# Patient Record
Sex: Female | Born: 1975 | Race: White | Hispanic: No | Marital: Married | Smoking: Former smoker
Health system: Southern US, Community
[De-identification: ages and names within clinical notes are randomized; demographics above are authoritative.]

## PROBLEM LIST (undated history)

## (undated) DIAGNOSIS — J45909 Unspecified asthma, uncomplicated: Secondary | ICD-10-CM

## (undated) HISTORY — DX: Unspecified asthma, uncomplicated: J45.909

---

## 2003-10-06 ENCOUNTER — Ambulatory Visit (HOSPITAL_COMMUNITY): Admission: RE | Admit: 2003-10-06 | Discharge: 2003-10-06 | Payer: Self-pay | Admitting: *Deleted

## 2004-10-31 ENCOUNTER — Other Ambulatory Visit: Admission: RE | Admit: 2004-10-31 | Discharge: 2004-10-31 | Payer: Self-pay | Admitting: Obstetrics & Gynecology

## 2011-03-30 ENCOUNTER — Emergency Department (HOSPITAL_COMMUNITY): Payer: Self-pay

## 2011-03-30 ENCOUNTER — Emergency Department (HOSPITAL_COMMUNITY)
Admission: EM | Admit: 2011-03-30 | Discharge: 2011-03-30 | Disposition: A | Discharge status: Home / Self Care | Payer: Self-pay | Attending: Emergency Medicine | Admitting: Emergency Medicine

## 2011-03-30 DIAGNOSIS — J45909 Unspecified asthma, uncomplicated: Secondary | ICD-10-CM | POA: Insufficient documentation

## 2011-03-30 DIAGNOSIS — IMO0002 Reserved for concepts with insufficient information to code with codable children: Secondary | ICD-10-CM | POA: Insufficient documentation

## 2011-03-30 DIAGNOSIS — M542 Cervicalgia: Secondary | ICD-10-CM | POA: Insufficient documentation

## 2011-03-30 DIAGNOSIS — R51 Headache: Secondary | ICD-10-CM | POA: Insufficient documentation

## 2011-03-30 DIAGNOSIS — S0993XA Unspecified injury of face, initial encounter: Secondary | ICD-10-CM | POA: Insufficient documentation

## 2011-03-30 DIAGNOSIS — W19XXXA Unspecified fall, initial encounter: Secondary | ICD-10-CM | POA: Insufficient documentation

## 2011-06-11 ENCOUNTER — Emergency Department (HOSPITAL_COMMUNITY): Payer: Self-pay

## 2011-06-11 ENCOUNTER — Emergency Department (HOSPITAL_COMMUNITY)
Admission: EM | Admit: 2011-06-11 | Discharge: 2011-06-12 | Disposition: A | Discharge status: Home / Self Care | Payer: Self-pay | Attending: Emergency Medicine | Admitting: Emergency Medicine

## 2011-06-11 ENCOUNTER — Emergency Department (HOSPITAL_COMMUNITY)
Admission: EM | Admit: 2011-06-11 | Discharge: 2011-06-11 | Discharge status: Against Medical Advice | Payer: Self-pay | Attending: Emergency Medicine | Admitting: Emergency Medicine

## 2011-06-11 DIAGNOSIS — Y92009 Unspecified place in unspecified non-institutional (private) residence as the place of occurrence of the external cause: Secondary | ICD-10-CM | POA: Insufficient documentation

## 2011-06-11 DIAGNOSIS — W2209XA Striking against other stationary object, initial encounter: Secondary | ICD-10-CM | POA: Insufficient documentation

## 2011-06-11 DIAGNOSIS — S99929A Unspecified injury of unspecified foot, initial encounter: Secondary | ICD-10-CM | POA: Insufficient documentation

## 2011-06-11 DIAGNOSIS — M79609 Pain in unspecified limb: Secondary | ICD-10-CM | POA: Insufficient documentation

## 2011-06-11 DIAGNOSIS — S8990XA Unspecified injury of unspecified lower leg, initial encounter: Secondary | ICD-10-CM | POA: Insufficient documentation

## 2011-06-11 DIAGNOSIS — S92919A Unspecified fracture of unspecified toe(s), initial encounter for closed fracture: Secondary | ICD-10-CM | POA: Insufficient documentation

## 2011-06-12 ENCOUNTER — Emergency Department (HOSPITAL_COMMUNITY): Payer: Self-pay

## 2012-07-13 ENCOUNTER — Ambulatory Visit (INDEPENDENT_AMBULATORY_CARE_PROVIDER_SITE_OTHER): Payer: 59 | Admitting: Psychiatry

## 2012-07-13 ENCOUNTER — Encounter (HOSPITAL_COMMUNITY): Payer: Self-pay | Admitting: Psychiatry

## 2012-07-13 VITALS — BP 109/79 | HR 47 | Ht 66.0 in | Wt 146.0 lb

## 2012-07-13 DIAGNOSIS — F411 Generalized anxiety disorder: Secondary | ICD-10-CM

## 2012-07-13 DIAGNOSIS — F332 Major depressive disorder, recurrent severe without psychotic features: Secondary | ICD-10-CM

## 2012-07-13 MED ORDER — BUPROPION HCL ER (XL) 150 MG PO TB24: 150.0000 mg | ORAL_TABLET | ORAL | Status: DC
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
## 2012-08-10 ENCOUNTER — Ambulatory Visit (HOSPITAL_COMMUNITY): Payer: Self-pay | Admitting: Psychiatry

## 2012-10-01 IMAGING — CR DG TOE 5TH 2+V*R*
3 series · 3 of 3 positions shown · non-contrast
Comparison: Right foot radiographs performed 06/11/2011

CLINICAL DATA: Status post reduction of right fifth toe fracture.

RIGHT TOE - 2+ VIEW

[x toes ap right]
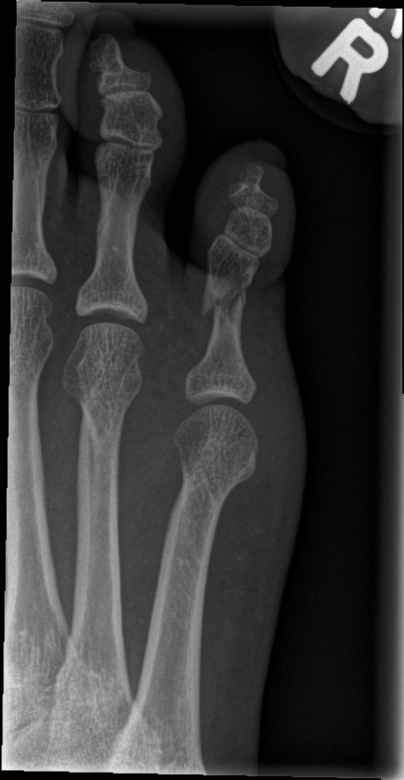

[x toes obl right]
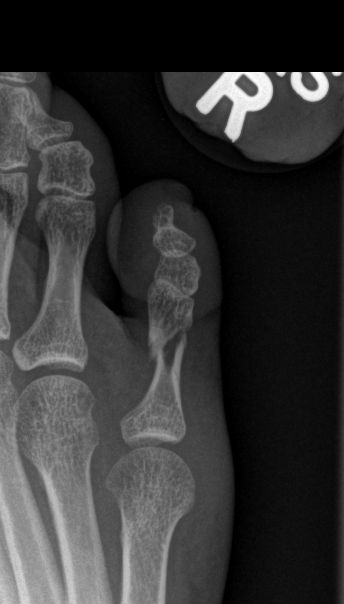

[x toes lat right]
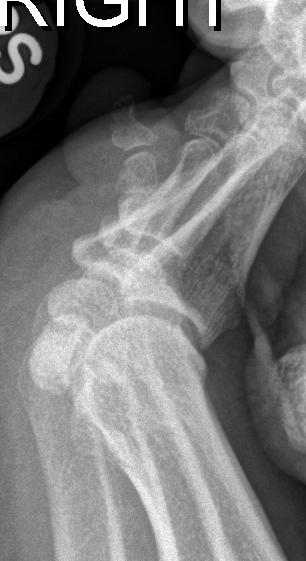

[3 of 3 positions shown; findings below may reference images not displayed]

FINDINGS: The patient's mildly comminuted fracture through the
fifth proximal phalanx demonstrates mildly improved alignment, with
slight residual medial displacement seen. There is no evidence of
intra-articular extension.  Visualized joint spaces are preserved.
Overlying soft tissue swelling is noted.
IMPRESSION: Mildly comminuted fracture through the fifth proximal phalanx
demonstrates mildly improved alignment, with slight residual medial
displacement seen.  No evidence of intra-articular extension.

## 2017-02-28 ENCOUNTER — Encounter (HOSPITAL_COMMUNITY): Payer: Self-pay | Admitting: Psychiatry

## 2017-02-28 ENCOUNTER — Ambulatory Visit (INDEPENDENT_AMBULATORY_CARE_PROVIDER_SITE_OTHER): Payer: 59 | Admitting: Psychiatry

## 2017-02-28 VITALS — BP 118/70 | HR 60 | Resp 16 | Ht 67.0 in | Wt 131.0 lb

## 2017-02-28 DIAGNOSIS — F411 Generalized anxiety disorder: Secondary | ICD-10-CM | POA: Diagnosis not present

## 2017-02-28 DIAGNOSIS — F39 Unspecified mood [affective] disorder: Secondary | ICD-10-CM | POA: Diagnosis not present

## 2017-02-28 DIAGNOSIS — F1290 Cannabis use, unspecified, uncomplicated: Secondary | ICD-10-CM

## 2017-02-28 DIAGNOSIS — F331 Major depressive disorder, recurrent, moderate: Secondary | ICD-10-CM | POA: Diagnosis not present

## 2017-02-28 DIAGNOSIS — Z87891 Personal history of nicotine dependence: Secondary | ICD-10-CM

## 2017-02-28 MED ORDER — LAMOTRIGINE 25 MG PO TABS: 25.0000 mg | ORAL_TABLET | Freq: Every day | ORAL | 0 refills | Status: DC

## 2017-02-28 MED ORDER — FLUOXETINE HCL 10 MG PO CAPS: 10.0000 mg | ORAL_CAPSULE | Freq: Every day | ORAL | 0 refills | Status: DC

## 2017-02-28 MED ORDER — DULOXETINE HCL 30 MG PO CPEP: 30.0000 mg | ORAL_CAPSULE | Freq: Every day | ORAL | 0 refills | Status: DC

## 2017-02-28 MED ORDER — BUSPIRONE HCL 7.5 MG PO TABS: 7.5000 mg | ORAL_TABLET | Freq: Every day | ORAL | 0 refills | Status: DC
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
## 2017-03-27 ENCOUNTER — Other Ambulatory Visit (HOSPITAL_COMMUNITY): Payer: Self-pay | Admitting: Psychiatry

## 2017-03-27 NOTE — Telephone Encounter (Signed)
Received fax from [facility] requesting a refill for Buspar, Lamictal and Prozac. Per Dr. Gilmore LarocheAkhtar, refills are authorize for Buspar 7.5mg , #30, Lamictal 25mg , #60 and Prozac 10mg , #30. Refills were sent to pharmacy. Pt's next apt is schedule on 04/01/17. Nothing further is need at this time.

## 2017-04-01 ENCOUNTER — Ambulatory Visit (HOSPITAL_COMMUNITY): Payer: Self-pay | Admitting: Psychiatry

## 2017-04-09 ENCOUNTER — Ambulatory Visit (HOSPITAL_COMMUNITY): Payer: Self-pay | Admitting: Psychiatry
